# Patient Record
Sex: Female | Born: 1965 | Hispanic: No | Marital: Single | Smoking: Never smoker
Health system: Southern US, Community
[De-identification: ages and names within clinical notes are randomized; demographics above are authoritative.]

## PROBLEM LIST (undated history)

## (undated) DIAGNOSIS — I10 Essential (primary) hypertension: Secondary | ICD-10-CM

## (undated) DIAGNOSIS — E785 Hyperlipidemia, unspecified: Secondary | ICD-10-CM

## (undated) DIAGNOSIS — D649 Anemia, unspecified: Secondary | ICD-10-CM

## (undated) DIAGNOSIS — E559 Vitamin D deficiency, unspecified: Secondary | ICD-10-CM

## (undated) HISTORY — DX: Vitamin D deficiency, unspecified: E55.9

## (undated) HISTORY — DX: Morbid (severe) obesity due to excess calories: E66.01

## (undated) HISTORY — DX: Hyperlipidemia, unspecified: E78.5

## (undated) HISTORY — DX: Anemia, unspecified: D64.9

## (undated) HISTORY — DX: Essential (primary) hypertension: I10

---

## 1998-03-14 ENCOUNTER — Encounter (HOSPITAL_COMMUNITY): Admission: RE | Admit: 1998-03-14 | Discharge: 1998-03-17 | Payer: Self-pay | Admitting: Obstetrics & Gynecology

## 1998-03-15 ENCOUNTER — Inpatient Hospital Stay (HOSPITAL_COMMUNITY): Admission: AD | Admit: 1998-03-15 | Discharge: 1998-03-19 | Payer: Self-pay | Admitting: Obstetrics

## 1998-03-21 ENCOUNTER — Inpatient Hospital Stay (HOSPITAL_COMMUNITY): Admission: AD | Admit: 1998-03-21 | Discharge: 1998-03-21 | Payer: Self-pay | Admitting: Obstetrics & Gynecology

## 1998-05-08 ENCOUNTER — Inpatient Hospital Stay (HOSPITAL_COMMUNITY): Admission: AD | Admit: 1998-05-08 | Discharge: 1998-05-08 | Payer: Self-pay | Admitting: Obstetrics & Gynecology

## 1999-09-24 ENCOUNTER — Emergency Department (HOSPITAL_COMMUNITY): Admission: EM | Admit: 1999-09-24 | Discharge: 1999-09-24 | Payer: Self-pay | Admitting: Emergency Medicine

## 2000-11-22 ENCOUNTER — Emergency Department (HOSPITAL_COMMUNITY): Admission: EM | Admit: 2000-11-22 | Discharge: 2000-11-22 | Payer: Self-pay | Admitting: *Deleted

## 2001-01-04 ENCOUNTER — Emergency Department (HOSPITAL_COMMUNITY): Admission: EM | Admit: 2001-01-04 | Discharge: 2001-01-04 | Payer: Self-pay | Admitting: *Deleted

## 2004-06-02 ENCOUNTER — Emergency Department (HOSPITAL_COMMUNITY): Admission: EM | Admit: 2004-06-02 | Discharge: 2004-06-02 | Payer: Self-pay | Admitting: Emergency Medicine

## 2005-08-04 ENCOUNTER — Emergency Department (HOSPITAL_COMMUNITY): Admission: EM | Admit: 2005-08-04 | Discharge: 2005-08-04 | Payer: Self-pay | Admitting: Emergency Medicine

## 2008-12-27 HISTORY — PX: BREAST BIOPSY: SHX20

## 2009-01-16 ENCOUNTER — Encounter: Admission: RE | Admit: 2009-01-16 | Discharge: 2009-01-16 | Payer: Self-pay | Admitting: Emergency Medicine

## 2009-01-23 ENCOUNTER — Encounter: Admission: RE | Admit: 2009-01-23 | Discharge: 2009-01-23 | Payer: Self-pay | Admitting: Emergency Medicine

## 2009-02-11 ENCOUNTER — Encounter (INDEPENDENT_AMBULATORY_CARE_PROVIDER_SITE_OTHER): Payer: Self-pay | Admitting: Emergency Medicine

## 2009-02-11 ENCOUNTER — Encounter: Admission: RE | Admit: 2009-02-11 | Discharge: 2009-02-11 | Payer: Self-pay | Admitting: Emergency Medicine

## 2010-03-30 ENCOUNTER — Ambulatory Visit (HOSPITAL_COMMUNITY): Admission: RE | Admit: 2010-03-30 | Discharge: 2010-03-30 | Payer: Self-pay | Admitting: Emergency Medicine

## 2011-01-17 ENCOUNTER — Encounter: Payer: Self-pay | Admitting: Emergency Medicine

## 2011-03-05 ENCOUNTER — Other Ambulatory Visit (HOSPITAL_COMMUNITY): Payer: Self-pay | Admitting: Diagnostic Radiology

## 2011-04-27 ENCOUNTER — Other Ambulatory Visit (HOSPITAL_COMMUNITY): Payer: Self-pay | Admitting: Family Medicine

## 2011-04-27 DIAGNOSIS — Z1231 Encounter for screening mammogram for malignant neoplasm of breast: Secondary | ICD-10-CM

## 2011-05-05 ENCOUNTER — Ambulatory Visit (HOSPITAL_COMMUNITY)
Admission: RE | Admit: 2011-05-05 | Discharge: 2011-05-05 | Disposition: A | Discharge status: Home / Self Care | Payer: Self-pay | Source: Ambulatory Visit | Attending: Family Medicine | Admitting: Family Medicine

## 2011-05-05 DIAGNOSIS — Z1231 Encounter for screening mammogram for malignant neoplasm of breast: Secondary | ICD-10-CM

## 2012-05-08 ENCOUNTER — Other Ambulatory Visit (HOSPITAL_COMMUNITY): Payer: Self-pay | Admitting: Family Medicine

## 2012-05-08 DIAGNOSIS — Z1231 Encounter for screening mammogram for malignant neoplasm of breast: Secondary | ICD-10-CM

## 2012-06-02 ENCOUNTER — Ambulatory Visit (HOSPITAL_COMMUNITY)
Admission: RE | Admit: 2012-06-02 | Discharge: 2012-06-02 | Disposition: A | Discharge status: Home / Self Care | Payer: Medicaid Other | Source: Ambulatory Visit | Attending: Family Medicine | Admitting: Family Medicine

## 2012-06-02 DIAGNOSIS — Z1231 Encounter for screening mammogram for malignant neoplasm of breast: Secondary | ICD-10-CM | POA: Insufficient documentation

## 2013-06-06 ENCOUNTER — Other Ambulatory Visit (HOSPITAL_COMMUNITY): Payer: Self-pay | Admitting: *Deleted

## 2013-06-06 DIAGNOSIS — Z1231 Encounter for screening mammogram for malignant neoplasm of breast: Secondary | ICD-10-CM

## 2013-06-19 ENCOUNTER — Ambulatory Visit (HOSPITAL_COMMUNITY)
Admission: RE | Admit: 2013-06-19 | Discharge: 2013-06-19 | Disposition: A | Discharge status: Home / Self Care | Payer: Medicaid Other | Source: Ambulatory Visit | Attending: *Deleted | Admitting: *Deleted

## 2013-06-19 DIAGNOSIS — Z1231 Encounter for screening mammogram for malignant neoplasm of breast: Secondary | ICD-10-CM | POA: Insufficient documentation

## 2014-06-05 ENCOUNTER — Other Ambulatory Visit (HOSPITAL_COMMUNITY): Payer: Self-pay | Admitting: *Deleted

## 2014-06-05 DIAGNOSIS — Z1231 Encounter for screening mammogram for malignant neoplasm of breast: Secondary | ICD-10-CM

## 2014-06-25 ENCOUNTER — Ambulatory Visit (HOSPITAL_COMMUNITY)
Admission: RE | Admit: 2014-06-25 | Discharge: 2014-06-25 | Disposition: A | Discharge status: Home / Self Care | Payer: Medicaid Other | Source: Ambulatory Visit | Attending: *Deleted | Admitting: *Deleted

## 2014-06-25 DIAGNOSIS — Z1231 Encounter for screening mammogram for malignant neoplasm of breast: Secondary | ICD-10-CM

## 2014-06-25 DIAGNOSIS — Z803 Family history of malignant neoplasm of breast: Secondary | ICD-10-CM | POA: Insufficient documentation

## 2015-08-18 ENCOUNTER — Other Ambulatory Visit (HOSPITAL_COMMUNITY): Payer: Self-pay | Admitting: *Deleted

## 2015-08-18 DIAGNOSIS — Z1231 Encounter for screening mammogram for malignant neoplasm of breast: Secondary | ICD-10-CM

## 2015-08-28 ENCOUNTER — Ambulatory Visit (HOSPITAL_COMMUNITY)
Admission: RE | Admit: 2015-08-28 | Discharge: 2015-08-28 | Disposition: A | Discharge status: Home / Self Care | Payer: Medicaid Other | Source: Ambulatory Visit | Attending: *Deleted | Admitting: *Deleted

## 2015-08-28 DIAGNOSIS — Z1231 Encounter for screening mammogram for malignant neoplasm of breast: Secondary | ICD-10-CM | POA: Insufficient documentation

## 2017-07-01 ENCOUNTER — Encounter: Payer: Self-pay | Admitting: Gastroenterology

## 2017-08-10 ENCOUNTER — Telehealth: Payer: Self-pay | Admitting: *Deleted

## 2017-08-10 NOTE — Telephone Encounter (Signed)
Office visit would be best. Thanks 

## 2017-08-10 NOTE — Telephone Encounter (Signed)
Attempted pt again - Lm to return call at [phone] and [facility] for [facility]. Hilda LiasMarie PV

## 2017-08-10 NOTE — Telephone Encounter (Signed)
Attempted pt. Left a message to call back and ask for me in pre visit.  Cassidy Taylor PV

## 2017-08-10 NOTE — Telephone Encounter (Signed)
Dr Adela LankArmbruster,  This pt is scheduled for a pre visit on 8-27 and a direct colon with you on 9-17 Monday in the [facility].  She has no GI history.  She has a medical history of HTN, HLD, Vitamin d def. She has a weight of 317.0 lb and a BMI of 54.4.  Do you want her to have an OV or can she be a direct hospital ?  Please advise, thank you for your time, Elizebeth BrookingMarie PV

## 2017-08-11 NOTE — Telephone Encounter (Signed)
Called pt- LM to call [phone] and ask for a pre visit nurse- Hilda LiasMarie PV

## 2017-08-16 ENCOUNTER — Encounter: Payer: Self-pay | Admitting: Gastroenterology

## 2017-08-16 NOTE — Telephone Encounter (Signed)
10-23 at 3 pm Ov with Dr Adela LankHilda Lias PV

## 2017-08-16 NOTE — Telephone Encounter (Signed)
Called pt at home number listed- Lm to call [phone]. Called work number lsited and they state pt has not worked there in 2 years. Removed work number from [employer] chart. Cassidy Taylor

## 2017-09-12 ENCOUNTER — Encounter: Payer: Self-pay | Admitting: Gastroenterology

## 2017-10-18 ENCOUNTER — Ambulatory Visit (INDEPENDENT_AMBULATORY_CARE_PROVIDER_SITE_OTHER): Payer: Self-pay | Admitting: Gastroenterology

## 2017-10-18 ENCOUNTER — Encounter (INDEPENDENT_AMBULATORY_CARE_PROVIDER_SITE_OTHER): Payer: Self-pay

## 2017-10-18 ENCOUNTER — Encounter: Payer: Self-pay | Admitting: Gastroenterology

## 2017-10-18 VITALS — BP 130/70 | HR 80 | Ht 64.5 in | Wt 316.0 lb

## 2017-10-18 DIAGNOSIS — Z1211 Encounter for screening for malignant neoplasm of colon: Secondary | ICD-10-CM

## 2017-10-18 NOTE — Progress Notes (Signed)
   HPI :  51 y/o female with history of morbid obesity (BMI > 50), HTN, HLD, here for a new patient visit to discuss colon cancer screening.  Patient states she has had blood in the stools, scant amount, 2 times in the past year, has occurred in the setting of constipation. She normally does not have constipation, has normal bowel habits. She denies any abdominal pains otherwise. No nausea / vomiting. She denies any FH of colon cancer. She thinks some of her sisters have had colon polyps.   She otherwise has no complaints today, feels well. We discussed options for colon cancer screening in light of her BMI.    Labs: 07/11/2017 - Hgb 12.1, WBC 10.8, plt 276 LFTs normal    Past Medical History:  Diagnosis Date  . Hyperlipemia   . Hypertension   . Obesity, morbid, BMI 50 or higher (HCC)   . Vitamin D deficiency      Past Surgical History:  Procedure Laterality Date  . CESAREAN SECTION  02/1998   Family History  Problem Relation Age of Onset  . Breast cancer Mother    Social History  Substance Use Topics  . Smoking status: Never Smoker  . Smokeless tobacco: Never Used  . Alcohol use No   Current Outpatient Prescriptions  Medication Sig Dispense Refill  . ergocalciferol (VITAMIN D2) 50000 units capsule Take 50,000 Units by mouth once a week.    Marland Kitchen. ibuprofen (ADVIL,MOTRIN) 200 MG tablet Take 200 mg by mouth as needed.    Marland Kitchen. lisinopril-hydrochlorothiazide (PRINZIDE,ZESTORETIC) 20-25 MG tablet Take 1 tablet by mouth daily.    . simvastatin (ZOCOR) 40 MG tablet Take 40 mg by mouth daily.     No current facility-administered medications for this visit.    No Known Allergies   Review of Systems: All systems reviewed and negative except where noted in HPI.   Labs per primary care in HPI above  Physical Exam: BP 130/70   Pulse 80   Ht 5' 4.5" (1.638 m)   Wt (!) 316 lb (143.3 kg)   LMP 10/12/2017 (Exact Date)   BMI 53.40 kg/m  Constitutional: Pleasant  female in no  acute distress. HEENT: Normocephalic and atraumatic. Conjunctivae are normal. No scleral icterus. Neck supple.  Cardiovascular: Normal rate, regular rhythm.  Pulmonary/chest: Effort normal and breath sounds normal. No wheezing, rales or rhonchi. Abdominal: Soft, nondistended, protuberant / obese abdomen. There are no masses palpable. No hepatomegaly. Extremities: no edema Lymphadenopathy: No cervical adenopathy noted. Neurological: Alert and oriented to person place and time. Skin: Skin is warm and dry. No rashes noted. Psychiatric: Normal mood and affect. Behavior is normal.   ASSESSMENT AND PLAN: 51 y/o female with a history of morbid obesity, BMI > 50, here to discuss options for colon cancer screening. We discussed optical colonoscopy versus stools based testing, risks / benefits of each. Given her BMI she is higher than average risk for anesthesia related complications, and her case would need to be done at the hospital. In light of her prior episodes of rare rectal bleeding, optical colonoscopy would be most appropriate for her, ensure no bleeding polyp / mass lesion. Following discussion of this issue, she wanted to proceed with colonoscopy. Further recommendations pending the result.  Ileene PatrickSteven Alizah Sills, MD [facility] Pager [phone]

## 2017-10-18 NOTE — Patient Instructions (Addendum)
It has been recommended to you by your physician that you have a colonoscopy completed. Per your request, we did not schedule the procedure(s) today. We will place you on our wait list for November and December as those dates are currently full.  If nothing comes available we will contact you once the January 2019 schedule is available.    Thank you.

## 2017-10-20 ENCOUNTER — Telehealth: Payer: Self-pay

## 2017-10-20 NOTE — Telephone Encounter (Signed)
Left message on pt's cell phone to call back regarding [facility]

## 2017-10-20 NOTE — Telephone Encounter (Signed)
Called pt and left message about an new opening Dr. Adela LankArmbruster has on [facility]-28-17 [facility]  Procedure would be at 11:45am with arrival time at 10:15am.  Left message to call me back to discuss.  (Pt was in clinic this week but his schedule was full for November 2nd and December 4th).

## 2017-10-26 NOTE — Telephone Encounter (Signed)
Never heard back from pt regarding 10-28-17 opening.  January schedule has opened up and [facility]erv[facility].  Left message for pt to call me back regarding getting colon scheduled.

## 2017-10-27 ENCOUNTER — Telehealth: Payer: Self-pay

## 2017-10-27 NOTE — Telephone Encounter (Signed)
-----   Message from Cooper RenderJanet M Shenika Quint, CMA sent at 10/18/2017  4:08 PM EDT ----- Regarding: [facility]  [facility] BMI over 50.  November and December dates were full:  10-28-2017 and  11-29-2017. Pt not interested in 12-19-17.   Call pt if 11-2 or 12-4 spot becomes available OR when January schedule becomes available.

## 2017-10-27 NOTE — Telephone Encounter (Signed)
See letter and telephone call messages regarding multiple attempts to call pt to [facility] with Dr[facility] Adela LankArmbruster.

## 2018-08-11 ENCOUNTER — Other Ambulatory Visit: Payer: Self-pay

## 2018-08-11 DIAGNOSIS — Z1231 Encounter for screening mammogram for malignant neoplasm of breast: Secondary | ICD-10-CM

## 2018-08-14 ENCOUNTER — Other Ambulatory Visit: Payer: Self-pay | Admitting: Obstetrics and Gynecology

## 2018-08-14 DIAGNOSIS — Z1231 Encounter for screening mammogram for malignant neoplasm of breast: Secondary | ICD-10-CM

## 2018-09-26 ENCOUNTER — Ambulatory Visit
Admission: RE | Admit: 2018-09-26 | Discharge: 2018-09-26 | Disposition: A | Discharge status: Home / Self Care | Payer: Self-pay | Source: Ambulatory Visit | Attending: Obstetrics and Gynecology | Admitting: Obstetrics and Gynecology

## 2018-09-26 ENCOUNTER — Ambulatory Visit (HOSPITAL_COMMUNITY)
Admission: RE | Admit: 2018-09-26 | Discharge: 2018-09-26 | Disposition: A | Discharge status: Home / Self Care | Payer: Medicaid Other | Source: Ambulatory Visit | Attending: Obstetrics and Gynecology | Admitting: Obstetrics and Gynecology

## 2018-09-26 ENCOUNTER — Encounter (HOSPITAL_COMMUNITY): Payer: Self-pay

## 2018-09-26 VITALS — BP 152/84 | Ht 65.0 in | Wt 304.0 lb

## 2018-09-26 DIAGNOSIS — Z1239 Encounter for other screening for malignant neoplasm of breast: Secondary | ICD-10-CM

## 2018-09-26 DIAGNOSIS — Z1231 Encounter for screening mammogram for malignant neoplasm of breast: Secondary | ICD-10-CM

## 2018-09-26 NOTE — Patient Instructions (Signed)
Explained breast self awareness with Cassidy Taylor. Patient did not need a Pap smear today due to last Pap smear was in June or July 2017 per patient. Let her know B[facility]CP will cover Pap smears every 3 years unless has a history of abnormal Pap smears. Referred patient to the [facility] for a screening mammogram. Appointment scheduled for Tuesday, September 26, 2018 at 1350. Patient aware of appointment and will be there. Let patient know the Breast Center will follow up with her within the next couple weeks with results of mammogram by letter or phone. [name] verbalized understanding.  Emery Binz, Kathaleen Maser, RN 1:16 PM

## 2018-09-26 NOTE — Progress Notes (Signed)
No complaints today.   Pap Smear: Pap smear not completed today. Last Pap smear was in June or July 2017 at the Urgent Care at [facility] and normal with negative HPV. Per patient has no history of an abnormal Pap smear. No Pap smear results are in Epic.  Physical exam: Breasts Breasts symmetrical. No skin abnormalities bilateral breasts. No nipple retraction bilateral breasts. No nipple discharge bilateral breasts. No lymphadenopathy. No lumps palpated bilateral breasts. No complaints of pain or tenderness on exam. Referred patient to the [facility] for a screening mammogram. Appointment scheduled for Tuesday, September 26, 2018 at 1350.        Pelvic/Bimanual No Pap smear completed today since last Pap smear was in June or July 2017 per patient. Pap smear not indicated per BCCCP guidelines.   Smoking History: Patient has never smoked.  Patient Navigation: Patient education provided. Access to services provided for patient through BCCCP program.   Colorectal Cancer Screening: Per patient has never had a colonoscopy completed. No complaints today. Patient has a a FIT Test at home that her PCP gave her to complete.  Breast and Cervical Cancer Risk Assessment: Patient has a family history of her mother having breast cancer. Patient has no known genetic mutations or history of radiation treatment to the chest before age 64. Patient has no history of cervical dysplasia, immunocompromised, or DES exposure in-utero.  Risk Assessment    Risk Scores      09/26/2018   Last edited by: Lynnell Dike, LPN   5-year risk: 1.9 %   Lifetime risk: 13.2 %

## 2018-10-23 ENCOUNTER — Encounter (HOSPITAL_COMMUNITY): Payer: Self-pay | Admitting: *Deleted

## 2018-11-16 IMAGING — MG DIGITAL SCREENING BILATERAL MAMMOGRAM WITH TOMO AND CAD
8 of 18 series · 8 of 40 positions shown · non-contrast
Comparison: Previous exam(s).

CLINICAL DATA: Screening.

EXAM:
DIGITAL SCREENING BILATERAL MAMMOGRAM WITH TOMO AND CAD

[R MLO synth-2D (1 of 2)]
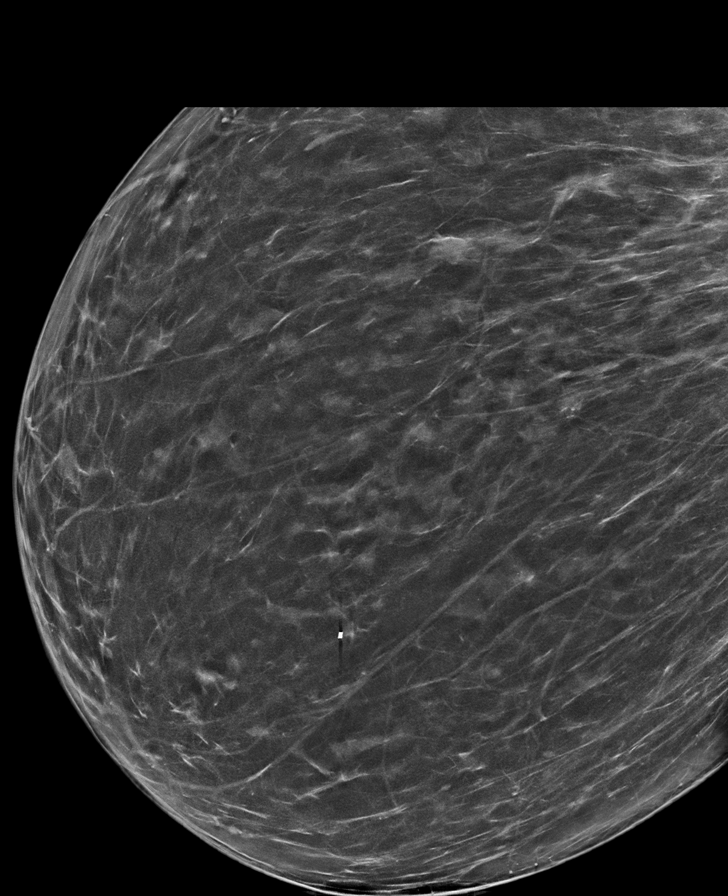

[L MLO synth-2D (1 of 3)]
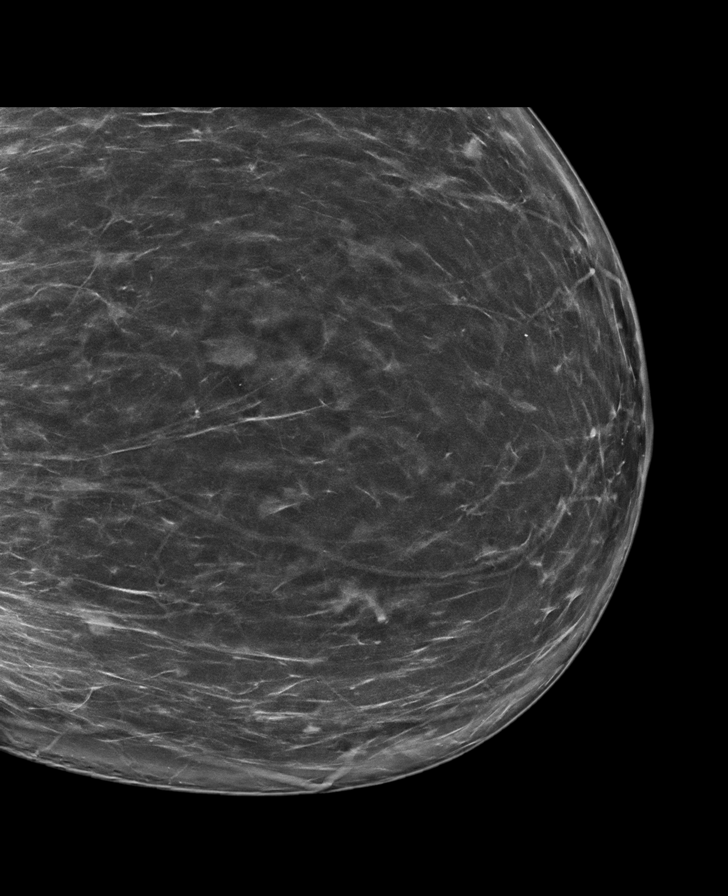

[R CC synth-2D]
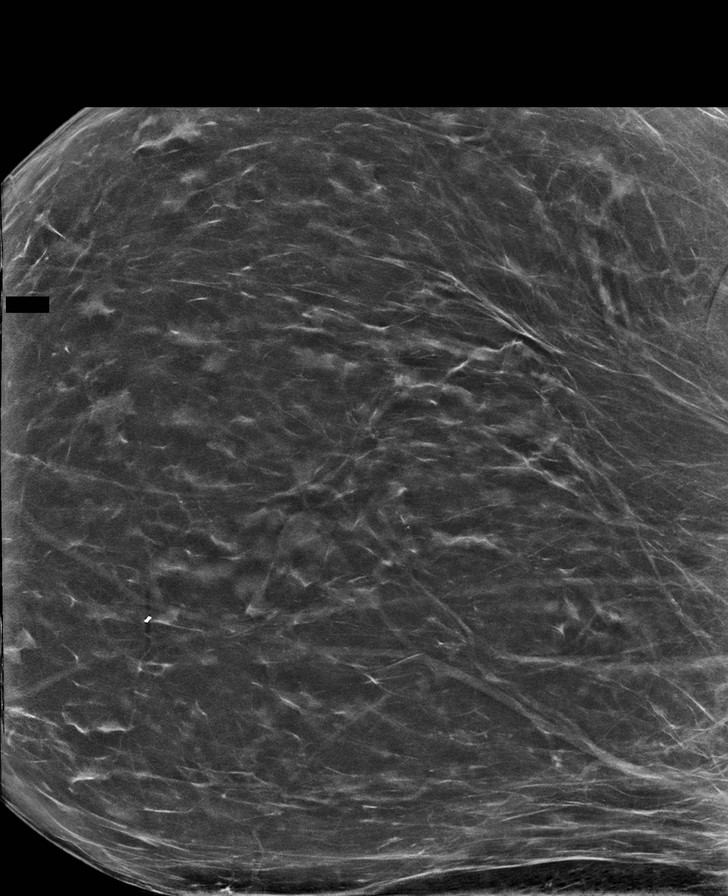

[L MLO synth-2D (2 of 3)]
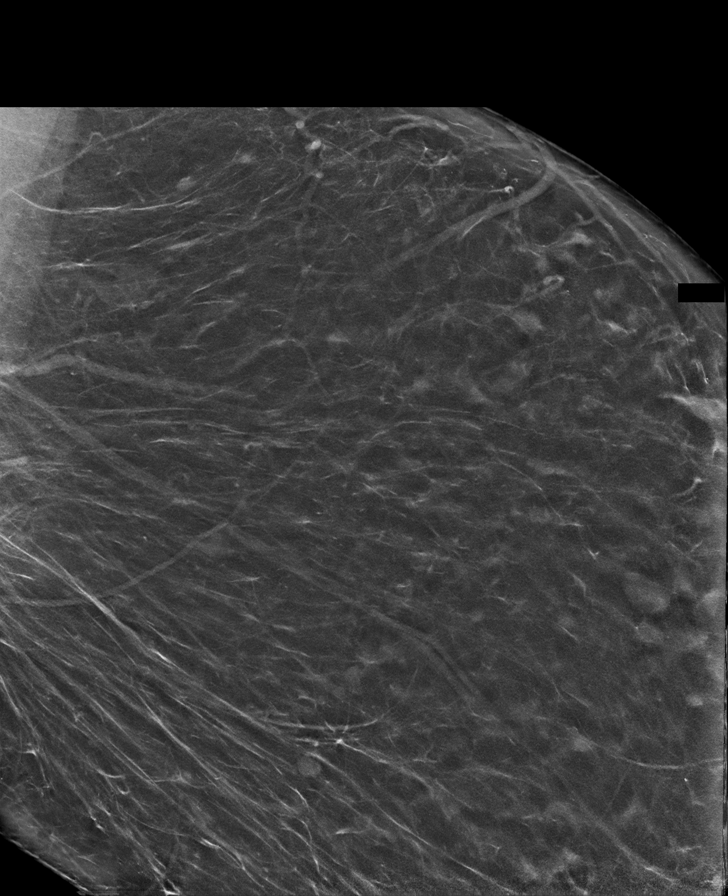

[R MLO synth-2D (2 of 2)]
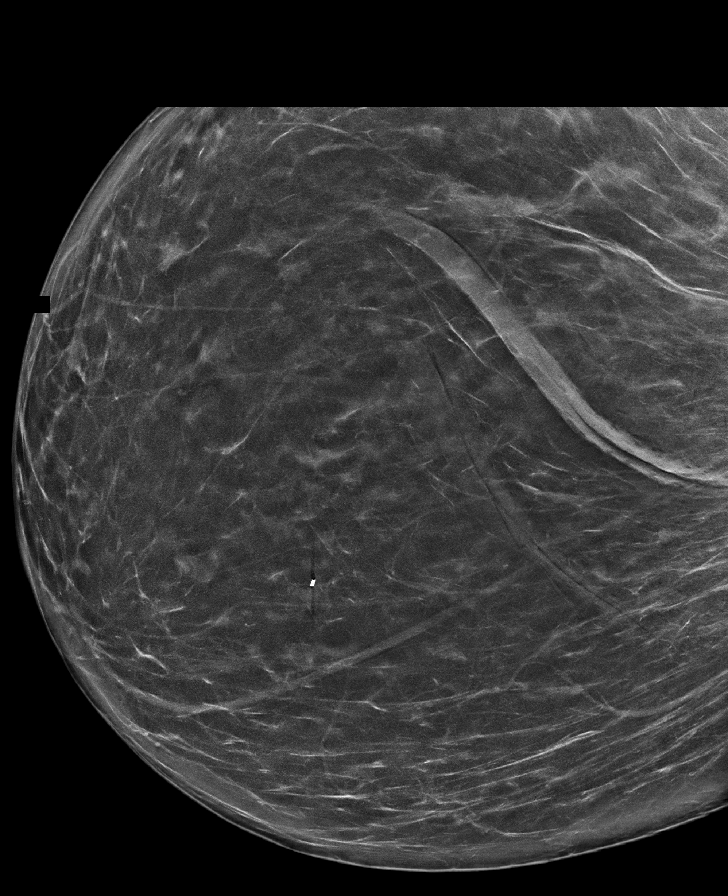

[R CV synth-2D]
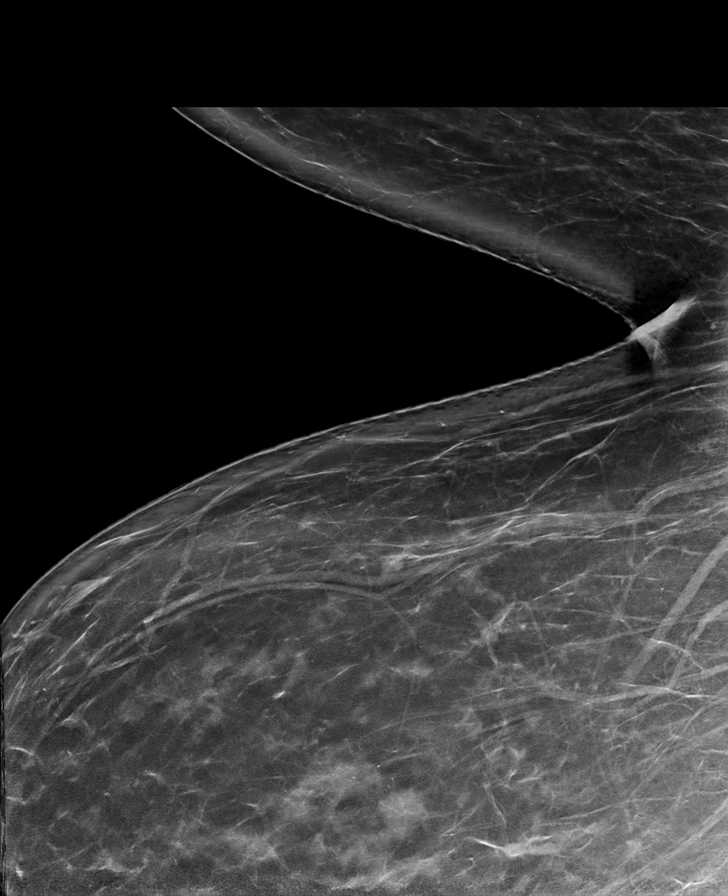

[L MLO synth-2D (3 of 3)]
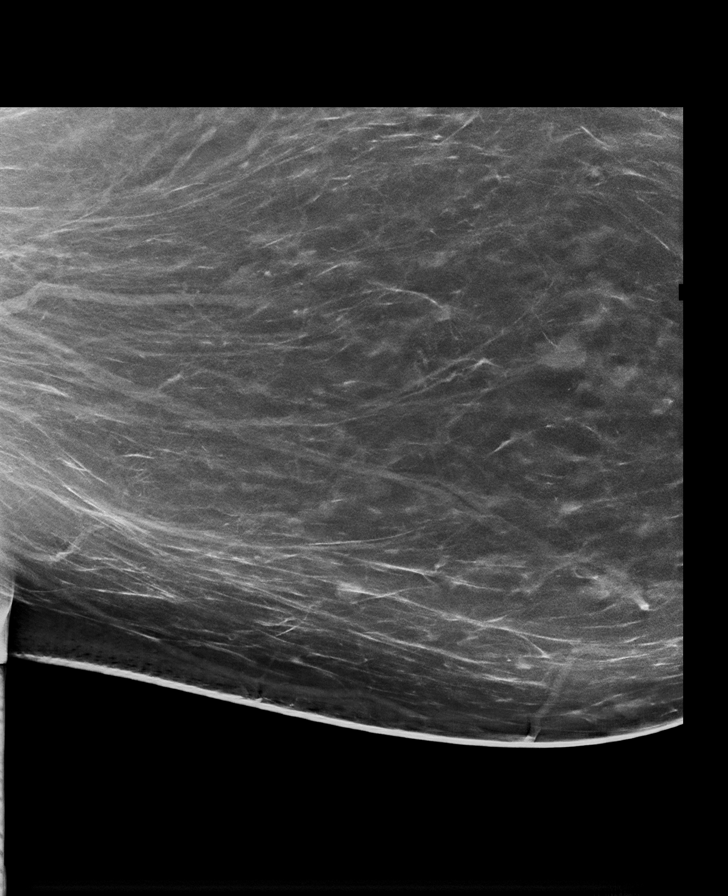

[L CC tomo · tomo slice 47/93.0]
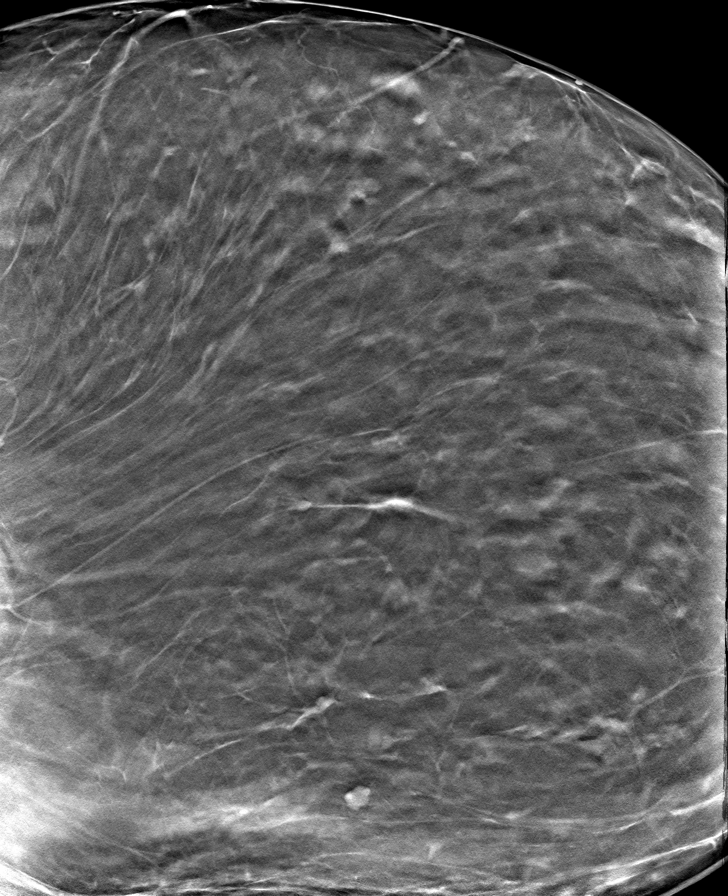

[8 of 40 positions shown; findings below may reference images not displayed]

ACR Breast Density Category b: There are scattered areas of
fibroglandular density.
FINDINGS: There are no findings suspicious for malignancy. Images were
processed with CAD.
IMPRESSION: No mammographic evidence of malignancy. A result letter of this
screening mammogram will be mailed directly to the patient.

RECOMMENDATION:
Screening mammogram in one year. (Code:CN-U-775)

BI-RADS CATEGORY  1: Negative.

## 2019-07-30 ENCOUNTER — Encounter (HOSPITAL_COMMUNITY): Payer: Self-pay

## 2019-07-30 ENCOUNTER — Emergency Department (HOSPITAL_COMMUNITY)
Admission: EM | Admit: 2019-07-30 | Discharge: 2019-07-30 | Disposition: A | Discharge status: Home / Self Care | Payer: PRIVATE HEALTH INSURANCE | Attending: Emergency Medicine | Admitting: Emergency Medicine

## 2019-07-30 ENCOUNTER — Other Ambulatory Visit: Payer: Self-pay

## 2019-07-30 DIAGNOSIS — I10 Essential (primary) hypertension: Secondary | ICD-10-CM | POA: Insufficient documentation

## 2019-07-30 DIAGNOSIS — L03114 Cellulitis of left upper limb: Secondary | ICD-10-CM

## 2019-07-30 DIAGNOSIS — Z79899 Other long term (current) drug therapy: Secondary | ICD-10-CM | POA: Insufficient documentation

## 2019-07-30 DIAGNOSIS — L239 Allergic contact dermatitis, unspecified cause: Secondary | ICD-10-CM | POA: Insufficient documentation

## 2019-07-30 MED ORDER — CEPHALEXIN 500 MG PO CAPS: 500.0000 mg | ORAL_CAPSULE | Freq: Four times a day (QID) | ORAL | 0 refills | Status: AC

## 2019-07-30 MED ORDER — PREDNISONE 20 MG PO TABS: 40.0000 mg | ORAL_TABLET | Freq: Every day | ORAL | 0 refills | Status: AC

## 2019-07-30 NOTE — ED Triage Notes (Signed)
Pt states that on Saturday, she was out and put her arm up on a couch. Pt now has red spots and blistering on her left arm, from her elbow up to her shoulder. Left hip and outside thigh, right hip. Pt describes the area as very itchy. Pt states that she put hydrocortisone 2.5% on it, which helped with itching.

## 2019-07-30 NOTE — Discharge Instructions (Addendum)
Please take the antibiotic as prescribed for possible skin infection.  Recommend taking the steroids for the suspected skin reaction.  Recommend taking as needed Benadryl for itching.  If he redness worsens, you develop fevers, nausea, vomiting, difficulty breathing or other new concerning symptoms recommend return to the ER for reassessment.  Please call your primary doctor to schedule appointment to be seen within 24-48 hours for recheck of your symptoms today.

## 2019-07-30 NOTE — ED Provider Notes (Signed)
[facility]-EMERGENCY DEPT Provider Note   CSN: [id number] Arrival date & time: 07/30/19  [id number]     History   Chief Complaint Chief Complaint  Patient presents with  . Rash    HPI Thomasenia BottomsKathy Stripling is a 53 y.o. female.  Saturday afternoon patient was at a friend's house sitting on a chair when she suspects that she may have had a bug bite to her left lateral arm.  States that she soon after noted some redness and itching of her left arm.  Later that evening also noted a couple small spots on her left hip and right hip area.  States they have been quite itchy, not particularly painful.  Has been taking Benadryl.  Has tried hydrocortisone cream with some improvement in symptoms.  Denies any fever or other systemic symptoms such as fatigue, difficulty breathing, chest pain, nausea, vomiting, abdominal pain.  Patient denies any sick contacts.  Patient states has a history of hypertension, hyperlipidemia.  Denies history of diabetes.     HPI  Past Medical History:  Diagnosis Date  . Hyperlipemia   . Hypertension   . Obesity, morbid, BMI 50 or higher (HCC)   . Vitamin D deficiency     There are no active problems to display for this patient.   Past Surgical History:  Procedure Laterality Date  . BREAST BIOPSY Right 2010   benign  . CESAREAN SECTION  02/1998     OB History    Gravida  3   Para      Term      Preterm      AB      Living  3     SAB      TAB      Ectopic      Multiple      Live Births  3            Home Medications    Prior to Admission medications   Medication Sig Start Date End Date Taking? Authorizing Provider  ergocalciferol (VITAMIN D2) 50000 units capsule Take 50,000 Units by mouth once a week.    [provider]  ibuprofen (ADVIL,MOTRIN) 200 MG tablet Take 200 mg by mouth as needed.    [provider]  lisinopril-hydrochlorothiazide (PRINZIDE,ZESTORETIC) 20-25 MG tablet Take 1 tablet by mouth  daily.    [provider]  simvastatin (ZOCOR) 40 MG tablet Take 40 mg by mouth daily.    [provider]    Family History Family History  Problem Relation Age of Onset  . Breast cancer Mother   . Cancer Father        lung    Social History Social History   Tobacco Use  . Smoking status: Never Smoker  . Smokeless tobacco: Never Used  Substance Use Topics  . Alcohol use: No  . Drug use: No     Allergies   Patient has no known allergies.   Review of Systems Review of Systems  Constitutional: Negative for chills and fever.  HENT: Negative for ear pain and sore throat.   Eyes: Negative for pain and visual disturbance.  Respiratory: Negative for cough and shortness of breath.   Cardiovascular: Negative for chest pain and palpitations.  Gastrointestinal: Negative for abdominal pain and vomiting.  Genitourinary: Negative for dysuria and hematuria.  Musculoskeletal: Negative for arthralgias and back pain.  Skin: Positive for rash. Negative for color change.  Neurological: Negative for seizures and syncope.  All other  systems reviewed and are negative.    Physical Exam Updated Vital Signs BP (!) 173/88 (BP Location: Right Arm)   Pulse (!) 127   Temp 99.2 F (37.3 C) (Oral)   Resp (!) 22   Ht 5\' 4"  (1.626 m)   Wt 131.5 kg   LMP 07/10/2019   SpO2 100%   BMI 49.78 kg/m   Physical Exam Vitals signs and nursing note reviewed.  Constitutional:      General: She is not in acute distress.    Appearance: She is well-developed.  HENT:     Head: Normocephalic and atraumatic.  Eyes:     Conjunctiva/sclera: Conjunctivae normal.  Neck:     Musculoskeletal: Neck supple.  Cardiovascular:     Rate and Rhythm: Regular rhythm. Tachycardia present.     Heart sounds: No murmur.  Pulmonary:     Effort: Pulmonary effort is normal. No respiratory distress.     Breath sounds: Normal breath sounds.  Abdominal:     Palpations: Abdomen is soft.      Tenderness: There is no abdominal tenderness.  Skin:    Comments: LUE: 4x10 cm area erythema over lateral upper arm extending to proximal elbow, 3 ~47mm raised blisters overlying, no purulence, negative nikolsky's not warm to touch, no underlying induration, normal elbow ROM Left lateral buttocks: 2cm diameter blanching patch of erythema, not raised R lateral buttocks: 2cm diameter blanching patch of erythema, not raised  Neurological:     Mental Status: She is alert.      ED Treatments / Results  Labs (all labs ordered are listed, but only abnormal results are displayed) Labs Reviewed - No data to display  EKG None  Radiology No results found.  Procedures Procedures (including critical care time)  Medications Ordered in ED Medications - No data to display   Initial Impression / Assessment and Plan / ED Course  I have reviewed the triage vital signs and the nursing notes.  Pertinent labs & imaging results that were available during my care of the patient were reviewed by me and considered in my medical decision making (see chart for details).        53 year old lady presents emergency department with new onset rash.  Negative Nikolsky sign, no new medications, no involvement of mucous membranes.  Based on description of symptoms and rash, suspect patient has some sort of contact dermatitis.  No clear trigger.  Given extent of erythema over arm, may have degree of superimposed cellulitis.   Noted initial tachycardia, patient stated she had walked a long distance right before her heart rate was checked.  She denied any systemic symptoms and remained well-appearing while in the department.  At this time believe appropriate for outpatient management.  Will cover with short course steroids and course of antibiotics.  Recommended recheck with primary doctor in 24 to 48 hours.   Final Clinical Impressions(s) / ED Diagnoses   Final diagnoses:  Allergic contact dermatitis,  unspecified trigger  Cellulitis of left upper extremity    ED Discharge Orders    None       Lucrezia Starch, MD 07/30/19 1108

## 2019-08-06 ENCOUNTER — Emergency Department (HOSPITAL_COMMUNITY)
Admission: EM | Admit: 2019-08-06 | Discharge: 2019-08-06 | Disposition: A | Discharge status: Home / Self Care | Payer: PRIVATE HEALTH INSURANCE | Attending: Emergency Medicine | Admitting: Emergency Medicine

## 2019-08-06 ENCOUNTER — Encounter (HOSPITAL_COMMUNITY): Payer: Self-pay | Admitting: Emergency Medicine

## 2019-08-06 ENCOUNTER — Other Ambulatory Visit: Payer: Self-pay

## 2019-08-06 DIAGNOSIS — Z79899 Other long term (current) drug therapy: Secondary | ICD-10-CM | POA: Insufficient documentation

## 2019-08-06 DIAGNOSIS — L299 Pruritus, unspecified: Secondary | ICD-10-CM | POA: Insufficient documentation

## 2019-08-06 DIAGNOSIS — Z5189 Encounter for other specified aftercare: Secondary | ICD-10-CM | POA: Insufficient documentation

## 2019-08-06 DIAGNOSIS — E876 Hypokalemia: Secondary | ICD-10-CM

## 2019-08-06 DIAGNOSIS — I10 Essential (primary) hypertension: Secondary | ICD-10-CM | POA: Insufficient documentation

## 2019-08-06 DIAGNOSIS — L03114 Cellulitis of left upper limb: Secondary | ICD-10-CM | POA: Insufficient documentation

## 2019-08-06 LAB — BASIC METABOLIC PANEL
Anion gap: 12 (ref 5–15)
BUN: 13 mg/dL (ref 6–20)
CO2: 24 mmol/L (ref 22–32)
Calcium: 9.4 mg/dL (ref 8.9–10.3)
Chloride: 99 mmol/L (ref 98–111)
Creatinine, Ser: 0.76 mg/dL (ref 0.44–1.00)
GFR calc Af Amer: >60 mL/min (ref >60)
GFR calc non Af Amer: >60 mL/min (ref >60)
Glucose, Bld: 109 mg/dL — ABNORMAL HIGH (ref 70–99)
Potassium: 2.9 mmol/L — ABNORMAL LOW (ref 3.5–5.1)
Sodium: 135 mmol/L (ref 135–145)

## 2019-08-06 LAB — CBC WITH DIFFERENTIAL/PLATELET
Abs Immature Granulocytes: 0.06 10*3/uL (ref 0.00–0.07)
Basophils Absolute: 0.1 10*3/uL (ref 0.0–0.1)
Basophils Relative: 1 %
Eosinophils Absolute: 0.1 10*3/uL (ref 0.0–0.5)
Eosinophils Relative: 1 %
HCT: 36.8 % (ref 36.0–46.0)
Hemoglobin: 11.5 g/dL — ABNORMAL LOW (ref 12.0–15.0)
Immature Granulocytes: 0 %
Lymphocytes Relative: 19 %
Lymphs Abs: 2.8 10*3/uL (ref 0.7–4.0)
MCH: 26.6 pg (ref 26.0–34.0)
MCHC: 31.3 g/dL (ref 30.0–36.0)
MCV: 85 fL (ref 80.0–100.0)
Monocytes Absolute: 0.7 10*3/uL (ref 0.1–1.0)
Monocytes Relative: 4 %
Neutro Abs: 11.4 10*3/uL — ABNORMAL HIGH (ref 1.7–7.7)
Neutrophils Relative %: 75 %
Platelets: 288 10*3/uL (ref 150–400)
RBC: 4.33 MIL/uL (ref 3.87–5.11)
RDW: 15.6 % — ABNORMAL HIGH (ref 11.5–15.5)
WBC: 15.1 10*3/uL — ABNORMAL HIGH (ref 4.0–10.5)
nRBC: 0 % (ref 0.0–0.2)

## 2019-08-06 MED ORDER — POTASSIUM CHLORIDE CRYS ER 20 MEQ PO TBCR: 20.0000 meq | EXTENDED_RELEASE_TABLET | Freq: Every day | ORAL | 0 refills | Status: DC

## 2019-08-06 NOTE — ED Triage Notes (Signed)
Pt was seen here on 8/3 for insect bit infection and has rash from side effects of medication and was told to come back for wound check.

## 2019-08-06 NOTE — Discharge Instructions (Addendum)
Recommend continuing Benadryl as needed for itching.  Recommend continuing current regimen of your blood pressure medication.  Please call your primary doctor for recheck of your blood pressure medicines, discussion about further management.  If you develop worsening redness, swelling, fever, chest pain or difficulty breathing please return to the emergency department for reassessment.  Recommend take any potassium supplement for the next week and having your primary doctor recheck your potassium levels.

## 2019-08-06 NOTE — ED Notes (Signed)
Call received from pt daughter Genise Strack [phone]- states unable to reach pt via mobile phone, request msg to be sent for pt to rtn call when possible. [employer]

## 2019-08-06 NOTE — ED Provider Notes (Signed)
[facility]-EMERGENCY DEPT Provider Note   CSN: [id number] Arrival date & time: 08/06/19  1410     History   Chief Complaint Chief Complaint  Patient presents with  . Wound Check    HPI Cassidy Taylor is a 53 y.o. female.  Patient presented to emergency department 1 week ago when I evaluated patient at that time.  Suspected either an allergic reaction or cellulitis related to a recent suspected insect bite on her left arm.  At that time I prescribed steroids and antibiotics.  Patient states she completed the course of steroids, completed the first 4 days of the antibiotics but due to suspected side effects from the antibiotic she discontinued this and did not finish the course.  States that her rash has had significant improvement, redness has gone away.  Has had some residual itching.  No associated fevers, chills.  States she wanted to be checked today to make sure everything was improving and she did need additional antibiotics given discontinuing the course early.  Also states that she felt somewhat anxious today noted her blood pressure was elevated.  Patient states that she noted a rash on her left arm after taking the dose of cephalexin, rash resolves spontaneously.  Also states she had one episode of loose stools that she attributed to the cephalexin.     HPI  Past Medical History:  Diagnosis Date  . Hyperlipemia   . Hypertension   . Obesity, morbid, BMI 50 or higher (HCC)   . Vitamin D deficiency     There are no active problems to display for this patient.   Past Surgical History:  Procedure Laterality Date  . BREAST BIOPSY Right 2010   benign  . CESAREAN SECTION  02/1998     OB History    Gravida  3   Para      Term      Preterm      AB      Living  3     SAB      TAB      Ectopic      Multiple      Live Births  3            Home Medications    Prior to Admission medications   Medication Sig Start Date End Date  Taking? Authorizing Provider  cephALEXin (KEFLEX) 500 MG capsule Take 1 capsule (500 mg total) by mouth 4 (four) times daily for 7 days. 07/30/19 08/06/19  Milagros Lollykstra, Jairon Ripberger S, MD  ergocalciferol (VITAMIN D2) 50000 units capsule Take 50,000 Units by mouth once a week.    [provider]  ibuprofen (ADVIL,MOTRIN) 200 MG tablet Take 200 mg by mouth as needed.    [provider]  lisinopril-hydrochlorothiazide (PRINZIDE,ZESTORETIC) 20-25 MG tablet Take 1 tablet by mouth daily.    [provider]  simvastatin (ZOCOR) 40 MG tablet Take 40 mg by mouth daily.    [provider]    Family History Family History  Problem Relation Age of Onset  . Breast cancer Mother   . Cancer Father        lung    Social History Social History   Tobacco Use  . Smoking status: Never Smoker  . Smokeless tobacco: Never Used  Substance Use Topics  . Alcohol use: No  . Drug use: No     Allergies   Patient has no known allergies.   Review of Systems Review of Systems  Constitutional:  Negative for chills and fever.  HENT: Negative for ear pain and sore throat.   Eyes: Negative for pain and visual disturbance.  Respiratory: Negative for cough and shortness of breath.   Cardiovascular: Negative for chest pain and palpitations.  Gastrointestinal: Negative for abdominal pain and vomiting.  Genitourinary: Negative for dysuria and hematuria.  Musculoskeletal: Negative for arthralgias and back pain.  Skin: Positive for rash. Negative for color change.  Neurological: Negative for seizures and syncope.  All other systems reviewed and are negative.    Physical Exam Updated Vital Signs BP (!) 169/93 (BP Location: Right Arm)   Pulse (!) 109   Temp 99 F (37.2 C) (Oral)   Resp 19   Wt (!) 146.1 kg   LMP 07/10/2019   SpO2 97%   BMI 55.27 kg/m   Physical Exam Vitals signs and nursing note reviewed.  Constitutional:      General: She is not in acute distress.     Appearance: She is well-developed.  HENT:     Head: Normocephalic and atraumatic.  Eyes:     Conjunctiva/sclera: Conjunctivae normal.  Neck:     Musculoskeletal: Neck supple.  Cardiovascular:     Rate and Rhythm: Normal rate and regular rhythm.     Heart sounds: No murmur.  Pulmonary:     Effort: Pulmonary effort is normal. No respiratory distress.     Breath sounds: Normal breath sounds.  Abdominal:     Palpations: Abdomen is soft.     Tenderness: There is no abdominal tenderness.  Skin:    Comments: Left upper extremity: Prior area of erythema over left elbow, left proximal arm has resolved, not warm to touch, small 1 cm diameter healing scab noted over left posterior elbow, no underlying induration Area of erythema over left buttocks, right buttocks has also resolved  Neurological:     Mental Status: She is alert.      ED Treatments / Results  Labs (all labs ordered are listed, but only abnormal results are displayed) Labs Reviewed - No data to display  EKG None  Radiology No results found.  Procedures Procedures (including critical care time)  Medications Ordered in ED Medications - No data to display   Initial Impression / Assessment and Plan / ED Course  I have reviewed the triage vital signs and the nursing notes.  Pertinent labs & imaging results that were available during my care of the patient were reviewed by me and considered in my medical decision making (see chart for details).        53 year old lady presented to the ER for wound recheck.  Today, area of concern on her left arm for possible cellulitis versus contact dermatitis has resolved after 4 days of antibiotics and steroid course.  Noted leukocytosis which may be from recent infection or steroids or both.  Given clinical resolution of those symptoms and afebrile, will defer additional antibiotics.  Patient concerned that her blood pressure was elevated today and requested blood work be  obtained.  Noted hypokalemia. Recommended course of K supplement with recheck by PCP. Patient long history of hypertension and followed by her primary doctor.  Recommended recheck of blood pressure with her primary doctor and reevaluation of her anti-hypertensive regimen.  Recommended Benadryl for any residual itching patient was continued to have.  Final Clinical Impressions(s) / ED Diagnoses   Final diagnoses:  Visit for wound check  Pruritus  Essential hypertension  Hypokalemia    ED Discharge Orders  None       Lucrezia Starch, MD 08/06/19 [phone]

## 2021-04-07 ENCOUNTER — Other Ambulatory Visit: Payer: Self-pay | Admitting: Nurse Practitioner

## 2021-04-07 ENCOUNTER — Other Ambulatory Visit: Payer: Self-pay | Admitting: *Deleted

## 2021-04-07 DIAGNOSIS — Z1231 Encounter for screening mammogram for malignant neoplasm of breast: Secondary | ICD-10-CM

## 2021-05-28 ENCOUNTER — Other Ambulatory Visit: Payer: Self-pay

## 2021-05-28 ENCOUNTER — Ambulatory Visit
Admission: RE | Admit: 2021-05-28 | Discharge: 2021-05-28 | Disposition: A | Discharge status: Home / Self Care | Payer: Medicaid Other | Source: Ambulatory Visit | Attending: *Deleted | Admitting: *Deleted

## 2021-05-28 DIAGNOSIS — Z1231 Encounter for screening mammogram for malignant neoplasm of breast: Secondary | ICD-10-CM

## 2022-06-18 LAB — RESULTS CONSOLE HPV: CHL HPV: NEGATIVE

## 2022-06-18 LAB — HM PAP SMEAR

## 2022-07-29 ENCOUNTER — Other Ambulatory Visit: Payer: Self-pay | Admitting: Nurse Practitioner

## 2022-07-29 DIAGNOSIS — Z1231 Encounter for screening mammogram for malignant neoplasm of breast: Secondary | ICD-10-CM

## 2022-08-10 ENCOUNTER — Ambulatory Visit
Admission: RE | Admit: 2022-08-10 | Discharge: 2022-08-10 | Disposition: A | Discharge status: Home / Self Care | Payer: Medicaid Other | Source: Ambulatory Visit | Attending: Nurse Practitioner | Admitting: Nurse Practitioner

## 2022-08-10 DIAGNOSIS — Z1231 Encounter for screening mammogram for malignant neoplasm of breast: Secondary | ICD-10-CM

## 2022-08-13 ENCOUNTER — Other Ambulatory Visit: Payer: Self-pay | Admitting: Nurse Practitioner

## 2022-08-13 DIAGNOSIS — R928 Other abnormal and inconclusive findings on diagnostic imaging of breast: Secondary | ICD-10-CM

## 2022-08-17 ENCOUNTER — Other Ambulatory Visit: Payer: Self-pay | Admitting: Nurse Practitioner

## 2022-08-17 ENCOUNTER — Ambulatory Visit: Payer: Medicaid Other

## 2022-08-17 ENCOUNTER — Ambulatory Visit
Admission: RE | Admit: 2022-08-17 | Discharge: 2022-08-17 | Disposition: A | Discharge status: Home / Self Care | Payer: Medicaid Other | Source: Ambulatory Visit | Attending: Nurse Practitioner | Admitting: Nurse Practitioner

## 2022-08-17 DIAGNOSIS — R928 Other abnormal and inconclusive findings on diagnostic imaging of breast: Secondary | ICD-10-CM

## 2023-02-22 ENCOUNTER — Ambulatory Visit
Admission: RE | Admit: 2023-02-22 | Discharge: 2023-02-22 | Disposition: A | Discharge status: Home / Self Care | Payer: Medicaid Other | Source: Ambulatory Visit | Attending: Nurse Practitioner | Admitting: Nurse Practitioner

## 2023-02-22 DIAGNOSIS — R928 Other abnormal and inconclusive findings on diagnostic imaging of breast: Secondary | ICD-10-CM

## 2023-08-25 ENCOUNTER — Other Ambulatory Visit: Payer: Self-pay | Admitting: Nurse Practitioner

## 2023-08-25 DIAGNOSIS — N6489 Other specified disorders of breast: Secondary | ICD-10-CM

## 2023-09-20 ENCOUNTER — Ambulatory Visit
Admission: RE | Admit: 2023-09-20 | Discharge: 2023-09-20 | Disposition: A | Discharge status: Home / Self Care | Payer: Medicaid Other | Source: Ambulatory Visit | Attending: Nurse Practitioner | Admitting: Nurse Practitioner

## 2023-09-20 DIAGNOSIS — N6489 Other specified disorders of breast: Secondary | ICD-10-CM

## 2024-05-20 ENCOUNTER — Encounter (HOSPITAL_COMMUNITY): Payer: Self-pay

## 2024-05-20 ENCOUNTER — Emergency Department (HOSPITAL_COMMUNITY)
Admission: EM | Admit: 2024-05-20 | Discharge: 2024-05-20 | Disposition: A | Discharge status: Home / Self Care | Attending: Student | Admitting: Student

## 2024-05-20 ENCOUNTER — Other Ambulatory Visit: Payer: Self-pay

## 2024-05-20 DIAGNOSIS — I10 Essential (primary) hypertension: Secondary | ICD-10-CM | POA: Diagnosis not present

## 2024-05-20 DIAGNOSIS — H05229 Edema of unspecified orbit: Secondary | ICD-10-CM | POA: Insufficient documentation

## 2024-05-20 DIAGNOSIS — R600 Localized edema: Secondary | ICD-10-CM

## 2024-05-20 LAB — URINALYSIS, ROUTINE W REFLEX MICROSCOPIC
Bilirubin Urine: NEGATIVE
Glucose, UA: NEGATIVE mg/dL
Ketones, ur: 20 mg/dL — AB
Nitrite: NEGATIVE
Protein, ur: 30 mg/dL — AB
Specific Gravity, Urine: 1.018 (ref 1.005–1.030)
Squamous Epithelial / HPF: >50 /HPF (ref 0–5)
WBC, UA: >50 WBC/hpf (ref 0–5)
pH: 5 (ref 5.0–8.0)

## 2024-05-20 MED ORDER — LORATADINE 10 MG PO TABS: 10.0000 mg | ORAL_TABLET | Freq: Every day | ORAL | 0 refills | Status: DC

## 2024-05-20 MED ORDER — LORATADINE 10 MG PO TABS
10.0000 mg | ORAL_TABLET | Freq: Once | ORAL | Status: AC
  Administered 2024-05-20: 10 mg via ORAL
  Filled 2024-05-20: qty 1

## 2024-05-20 NOTE — ED Triage Notes (Signed)
 Pt here with c/o with eye swelling yesterday morning. Eyes are still swollen. No drainage. There is some redness to both eyes and swelling. Pt does have seasonal allergies. No pain or itching. Vision is unchanged

## 2024-05-21 NOTE — ED Provider Notes (Signed)
 [place] EMERGENCY DEPARTMENT AT [facility] Provider Note  CSN: [id number] Arrival date & time: 05/20/24 1506  Chief Complaint(s) Eye Problem (swelling)  HPI Cassidy Taylor is a 58 y.o. female with PMH HTN, HLD who presents emergency room for evaluation of periorbital edema.  States that she woke up this morning with swelling around bilateral orbits that is fairly mild but is atypical for her.  She denies associated vision loss, eye pain, edema of the upper or lower extremities, shortness of breath or any other systemic symptoms.   Past Medical History Past Medical History:  Diagnosis Date   Hyperlipemia    Hypertension    Obesity, morbid, BMI 50 or higher (HCC)    Vitamin D deficiency    There are no active problems to display for this patient.  Home Medication(s) Prior to Admission medications   Medication Sig Start Date End Date Taking? Authorizing Provider  loratadine (CLARITIN) 10 MG tablet Take 1 tablet (10 mg total) by mouth daily. 05/20/24 06/19/24 Yes Zakirah Weingart, MD  ergocalciferol (VITAMIN D2) 50000 units capsule Take 50,000 Units by mouth once a week.    [provider]  ibuprofen (ADVIL,MOTRIN) 200 MG tablet Take 200 mg by mouth as needed.    [provider]  lisinopril-hydrochlorothiazide (PRINZIDE,ZESTORETIC) 20-25 MG tablet Take 1 tablet by mouth daily.    [provider]  potassium chloride  SA (K-DUR) 20 MEQ tablet Take 1 tablet (20 mEq total) by mouth daily. 08/06/19   Camellia Caves, MD  simvastatin (ZOCOR) 40 MG tablet Take 40 mg by mouth daily.    [provider]                                                                                                                                    Past Surgical History Past Surgical History:  Procedure Laterality Date   BREAST BIOPSY Right 2010   benign   CESAREAN SECTION  02/1998   Family History Family History  Problem Relation Age of Onset   Breast  cancer Mother    Cancer Father        lung    Social History Social History   Tobacco Use   Smoking status: Never   Smokeless tobacco: Never  Vaping Use   Vaping status: Never Used  Substance Use Topics   Alcohol use: No   Drug use: No   Allergies Patient has no known allergies.  Review of Systems Review of Systems  HENT:  Positive for facial swelling.     Physical Exam Vital Signs  I have reviewed the triage vital signs BP (!) 149/74   Pulse 64   Temp 98.7 F (37.1 C) (Oral)   Resp 19   Ht 5\' 5"  (1.651 m)   Wt 100.7 kg   LMP 07/10/2019   SpO2 100%   BMI 36.94 kg/m   Physical Exam Vitals and nursing note  reviewed.  Constitutional:      General: She is not in acute distress.    Appearance: She is well-developed.  HENT:     Head: Normocephalic and atraumatic.     Comments: Mild periorbital edema Eyes:     Conjunctiva/sclera: Conjunctivae normal.  Cardiovascular:     Rate and Rhythm: Normal rate and regular rhythm.     Heart sounds: No murmur heard. Pulmonary:     Effort: Pulmonary effort is normal. No respiratory distress.     Breath sounds: Normal breath sounds.  Abdominal:     Palpations: Abdomen is soft.     Tenderness: There is no abdominal tenderness.  Musculoskeletal:        General: No swelling.     Cervical back: Neck supple.  Skin:    General: Skin is warm and dry.     Capillary Refill: Capillary refill takes less than 2 seconds.  Neurological:     Mental Status: She is alert.  Psychiatric:        Mood and Affect: Mood normal.     ED Results and Treatments Labs (all labs ordered are listed, but only abnormal results are displayed) Labs Reviewed  URINALYSIS, ROUTINE W REFLEX MICROSCOPIC - Abnormal; Notable for the following components:      Result Value   Color, Urine AMBER (*)    APPearance CLOUDY (*)    Hgb urine dipstick SMALL (*)    Ketones, ur 20 (*)    Protein, ur 30 (*)    Leukocytes,Ua LARGE (*)    Bacteria, UA FEW (*)     All other components within normal limits                                                                                                                          Radiology No results found.  Pertinent labs & imaging results that were available during my care of the patient were reviewed by me and considered in my medical decision making (see MDM for details).  Medications Ordered in ED Medications  loratadine (CLARITIN) tablet 10 mg (10 mg Oral Given 05/20/24 1921)                                                                                                                                     Procedures Procedures  (including critical care time)  Medical Decision Making / ED Course  This patient presents to the ED for concern of periorbital edema, this involves an extensive number of treatment options, and is a complaint that carries with it a high risk of complications and morbidity.  The differential diagnosis includes gravity dependent edema, allergic conjunctivitis, nephrotic syndrome, medication side effect  MDM: Patient seen emergency room for evaluation of periorbital edema.  Physical exam with very mild periorbital edema, no associated erythema or proptosis.  Urinalysis with very minimal proteinuria, large esterase, greater than 2 white blood cells but this sample is quite contaminated with greater than 50 squamous epithelial cells.  I have overall lower suspicion for true nephrotic syndrome today as she has swelling nowhere else.   Higher suspicion for allergic conjunctivitis and patient given Claritin.  On reevaluation her symptoms are improving.  Given improvement with antihistamine therapy, this is likely allergic conjunctivitis and we will defer additional laboratory evaluation at this time.  At this time she does not meet inpatient criteria for admission and will be discharged with outpatient follow-up.  Return precautions given which she voiced  understanding.   Additional history obtained:  -External records from outside source obtained and reviewed including: Chart review including previous notes, labs, imaging, consultation notes   Lab Tests: -I ordered, reviewed, and interpreted labs.   The pertinent results include:   Labs Reviewed  URINALYSIS, ROUTINE W REFLEX MICROSCOPIC - Abnormal; Notable for the following components:      Result Value   Color, Urine AMBER (*)    APPearance CLOUDY (*)    Hgb urine dipstick SMALL (*)    Ketones, ur 20 (*)    Protein, ur 30 (*)    Leukocytes,Ua LARGE (*)    Bacteria, UA FEW (*)    All other components within normal limits      Medicines ordered and prescription drug management: Meds ordered this encounter  Medications   loratadine (CLARITIN) tablet 10 mg   loratadine (CLARITIN) 10 MG tablet    Sig: Take 1 tablet (10 mg total) by mouth daily.    Dispense:  30 tablet    Refill:  0    -I have reviewed the patients home medicines and have made adjustments as needed  Critical interventions none     Social Determinants of Health:  Factors impacting patients care include: none   Reevaluation: After the interventions noted above, I reevaluated the patient and found that they have :improved  Co morbidities that complicate the patient evaluation  Past Medical History:  Diagnosis Date   Hyperlipemia    Hypertension    Obesity, morbid, BMI 50 or higher (HCC)    Vitamin D deficiency       Dispostion: I considered admission for this patient, but this time she does not meet inpatient criteria for admission will be discharged outpatient follow-up.     Final Clinical Impression(s) / ED Diagnoses Final diagnoses:  Periorbital edema     @PCDICTATION @    Karlyn Overman, MD 05/21/24 [phone]

## 2024-07-26 ENCOUNTER — Telehealth: Payer: Self-pay

## 2024-07-26 ENCOUNTER — Other Ambulatory Visit: Payer: Self-pay | Admitting: Internal Medicine

## 2024-07-26 DIAGNOSIS — Z1231 Encounter for screening mammogram for malignant neoplasm of breast: Secondary | ICD-10-CM

## 2024-09-11 DIAGNOSIS — N6459 Other signs and symptoms in breast: Secondary | ICD-10-CM

## 2024-09-14 ENCOUNTER — Other Ambulatory Visit: Payer: Self-pay

## 2024-09-14 DIAGNOSIS — N644 Mastodynia: Secondary | ICD-10-CM

## 2024-09-20 ENCOUNTER — Ambulatory Visit

## 2024-09-20 ENCOUNTER — Other Ambulatory Visit

## 2024-09-20 ENCOUNTER — Encounter

## 2024-09-25 ENCOUNTER — Ambulatory Visit: Admission: RE | Admit: 2024-09-25 | Discharge: 2024-09-25 | Disposition: A | Source: Ambulatory Visit

## 2024-09-25 ENCOUNTER — Encounter

## 2024-09-25 ENCOUNTER — Other Ambulatory Visit

## 2024-09-25 ENCOUNTER — Ambulatory Visit
Admission: RE | Admit: 2024-09-25 | Discharge: 2024-09-25 | Disposition: A | Discharge status: Home / Self Care | Source: Ambulatory Visit

## 2024-09-25 DIAGNOSIS — N644 Mastodynia: Secondary | ICD-10-CM

## 2024-11-03 ENCOUNTER — Ambulatory Visit (INDEPENDENT_AMBULATORY_CARE_PROVIDER_SITE_OTHER)

## 2024-11-03 ENCOUNTER — Encounter (HOSPITAL_COMMUNITY): Payer: Self-pay | Admitting: *Deleted

## 2024-11-03 ENCOUNTER — Ambulatory Visit (HOSPITAL_COMMUNITY): Admission: EM | Admit: 2024-11-03 | Discharge: 2024-11-03 | Disposition: A | Discharge status: Home / Self Care

## 2024-11-03 DIAGNOSIS — S63501A Unspecified sprain of right wrist, initial encounter: Secondary | ICD-10-CM | POA: Diagnosis not present

## 2024-11-03 DIAGNOSIS — M25531 Pain in right wrist: Secondary | ICD-10-CM | POA: Diagnosis not present

## 2024-11-03 MED ORDER — DICLOFENAC SODIUM 50 MG PO TBEC
50.0000 mg | DELAYED_RELEASE_TABLET | Freq: Two times a day (BID) | ORAL | 1 refills | Status: AC
Start: 2024-11-03 — End: ?

## 2024-11-03 NOTE — Discharge Instructions (Addendum)
  1. Sprain of right wrist, unspecified location, initial encounter (Primary) - DG Wrist Complete Right x-ray performed in UC shows no acute fracture or dislocation to the right wrist. - diclofenac (VOLTAREN) 50 MG EC tablet; Take 1 tablet (50 mg total) by mouth 2 (two) times daily.  Dispense: 30 tablet; Refill: 1 - Apply Wrist brace to right wrist for protection and immobilization until injury improves - AMB referral to orthopedics for follow-up evaluation if symptoms do not improve with current therapy.

## 2024-11-03 NOTE — ED Provider Notes (Signed)
 [facility] [place]  Note:  This document was prepared using Conservation officer, historic buildings and may include unintentional dictation errors.  MRN: [id number] DOB: 1966/02/04  Subjective:   Cassidy Taylor is a 58 y.o. female presenting for evaluation of acute onset right wrist pain since yesterday.  Patient denies any known injury or trauma.  No past history of wrist or hand pain.  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  Patient has been wearing a wrist weight Velcro strap to decrease movement of wrist.  Patient denies taking any over-the-counter medication to treat symptoms prior to arrival in urgent care.  No current facility-administered medications for this encounter.  Current Outpatient Medications:    diclofenac (VOLTAREN) 50 MG EC tablet, Take 1 tablet (50 mg total) by mouth 2 (two) times daily., Disp: 30 tablet, Rfl: 1   simvastatin (ZOCOR) 40 MG tablet, Take 40 mg by mouth daily., Disp: , Rfl:    ergocalciferol (VITAMIN D2) 50000 units capsule, Take 50,000 Units by mouth once a week., Disp: , Rfl:    ibuprofen (ADVIL,MOTRIN) 200 MG tablet, Take 200 mg by mouth as needed., Disp: , Rfl:    lisinopril-hydrochlorothiazide (PRINZIDE,ZESTORETIC) 20-25 MG tablet, Take 1 tablet by mouth daily., Disp: , Rfl:    loratadine  (CLARITIN ) 10 MG tablet, Take 1 tablet (10 mg total) by mouth daily., Disp: 30 tablet, Rfl: 0   potassium chloride  SA (K-DUR) 20 MEQ tablet, Take 1 tablet (20 mEq total) by mouth daily., Disp: 7 tablet, Rfl: 0   Allergies  Allergen Reactions   Sulfa Antibiotics Other (See Comments) and Hives    When pt takes Sulfa drugs her face gets very heated    Past Medical History:  Diagnosis Date   Hyperlipemia    Hypertension    Obesity, morbid, BMI 50 or higher (HCC)    Vitamin D deficiency      Past Surgical History:  Procedure Laterality Date    BREAST BIOPSY Right 2010   benign   CESAREAN SECTION  02/1998    Family History  Problem Relation Age of Onset   Breast cancer Mother    Cancer Father        lung    Social History   Tobacco Use   Smoking status: Never   Smokeless tobacco: Never  Vaping Use   Vaping status: Never Used  Substance Use Topics   Alcohol use: No   Drug use: No    ROS Refer to HPI for ROS details.  Objective:    Vitals: BP 124/84 (BP Location: Left Arm)   Pulse 63   Temp 98.1 F (36.7 C) (Oral)   Resp 16   LMP 07/10/2019   SpO2 99%   Physical Exam Vitals and nursing note reviewed.  Constitutional:      General: She is not in acute distress.    Appearance: Normal appearance. She is well-developed. She is not ill-appearing or toxic-appearing.  HENT:     Head: Normocephalic and atraumatic.  Cardiovascular:     Rate and Rhythm: Normal rate.  Pulmonary:     Effort: Pulmonary effort is normal. No respiratory distress.  Musculoskeletal:     Right wrist: Swelling, tenderness, bony tenderness and snuff box tenderness present. No deformity or crepitus. Decreased range of motion. Normal pulse.  Skin:    General: Skin is warm and dry.  Neurological:  General: No focal deficit present.     Mental Status: She is alert and oriented to person, place, and time.  Psychiatric:        Mood and Affect: Mood normal.        Behavior: Behavior normal.     Procedures  No results found for this or any previous visit (from the past 24 hours).  Assessment and Plan :     Discharge Instructions       1. Sprain of right wrist, unspecified location, initial encounter (Primary) - DG Wrist Complete Right x-ray performed in UC shows no acute fracture or dislocation to the right wrist. - diclofenac (VOLTAREN) 50 MG EC tablet; Take 1 tablet (50 mg total) by mouth 2 (two) times daily.  Dispense: 30 tablet; Refill: 1 - Apply Wrist brace to right wrist for protection and immobilization until injury  improves - AMB referral to orthopedics for follow-up evaluation if symptoms do not improve with current therapy.      Rylie Limburg B Jakaria Lavergne   Brayn Eckstein, [place] B, [place] 11/03/24 1659

## 2024-11-03 NOTE — ED Triage Notes (Signed)
 Pt states she has right wrist pain since yesterday. 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 She is wearing a weight on that right wrist. She has not taken any meds.

## 2024-12-31 ENCOUNTER — Other Ambulatory Visit: Payer: Self-pay

## 2025-01-01 ENCOUNTER — Other Ambulatory Visit (HOSPITAL_COMMUNITY)
Admission: RE | Admit: 2025-01-01 | Discharge: 2025-01-01 | Disposition: A | Discharge status: Home / Self Care | Source: Ambulatory Visit | Attending: Obstetrics and Gynecology | Admitting: Obstetrics and Gynecology

## 2025-01-01 ENCOUNTER — Encounter: Payer: Self-pay | Admitting: Obstetrics and Gynecology

## 2025-01-01 ENCOUNTER — Ambulatory Visit: Admitting: Obstetrics and Gynecology

## 2025-01-01 VITALS — BP 113/79 | HR 67 | Ht 65.0 in | Wt 174.0 lb

## 2025-01-01 DIAGNOSIS — N898 Other specified noninflammatory disorders of vagina: Secondary | ICD-10-CM | POA: Diagnosis present

## 2025-01-01 DIAGNOSIS — R102 Pelvic and perineal pain unspecified side: Secondary | ICD-10-CM | POA: Diagnosis not present

## 2025-01-01 DIAGNOSIS — F39 Unspecified mood [affective] disorder: Secondary | ICD-10-CM | POA: Diagnosis not present

## 2025-01-01 NOTE — Progress Notes (Addendum)
 59 y.o. New GYN presents for vaginal looseness/pressure x4 months.  PHQ-9=7   //  GAD-7=2  Referral done.  Last Mammogram 09/25/24 Neg Last PAP  06/18/2022  NILM

## 2025-01-01 NOTE — Progress Notes (Signed)
 "  NEW GYNECOLOGY VISIT Chief Complaint  Patient presents with   New Patient (Initial Visit)    GYN     Subjective:  Cassidy Taylor is a 59 y.o. G3P0 who presents for pelvic pressure.  She is unsure if she has prolapse. Reports pressure/bulge feeling mostly when she urinates. Finds that her urine will sometimes veer to the side when she hovers over the toilet seat in a public bathroom. Denies urinary leakage. Denies pelvic pain. She notes some vaginal discharge Hx SVD x 2, C/S x 1 with larges baby > 9 lbs Some constipation No hx abnormal pap smears   OB History     Gravida  3   Para      Term      Preterm      AB      Living  3      SAB      IAB      Ectopic      Multiple      Live Births  3           Past Medical History:  Diagnosis Date   Anemia    Hyperlipemia    Hypertension    Obesity, morbid, BMI 50 or higher (HCC)    Vitamin D deficiency     Past Surgical History:  Procedure Laterality Date   BREAST BIOPSY Right 2010   benign   CESAREAN SECTION  02/1998    Social History   Socioeconomic History   Marital status: Single    Spouse name: Not on file   Number of children: 3   Years of education: Not on file   Highest education level: Not on file  Occupational History   Not on file  Tobacco Use   Smoking status: Never   Smokeless tobacco: Never  Vaping Use   Vaping status: Never Used  Substance and Sexual Activity   Alcohol use: No   Drug use: No   Sexual activity: Not Currently  Other Topics Concern   Not on file  Social History Narrative   Not on file   Social Drivers of Health   Tobacco Use: Low Risk (01/01/2025)   Patient History    Smoking Tobacco Use: Never    Smokeless Tobacco Use: Never    Passive Exposure: Not on file  Financial Resource Strain: Not on File (04/15/2022)   Received from [employer]    Financial Resource Strain: 0  Food Insecurity: Not at Risk (07/19/2024)   Received from  [facility] Insecurity    Within the past 12 months, you worried that your food would run out before you got money to buy more.: 1  Transportation Needs: Not at Risk (07/19/2024)   Received from [facility] Needs    In the past 12 months, has lack of transportation kept you from medical appointments, meetings, work or from getting things needed for daily living?: 1  Physical Activity: Not on File (04/15/2022)   Received from [facility]   Physical Activity    Physical Activity: 0  Stress: Not on File (04/15/2022)   Received from [facility]   Stress    Stress: 0  Social Connections: Not on File (09/05/2023)   Received from [facility]   Social Connections    Connectedness: 0  Depression (PHQ2-9): Medium Risk (01/01/2025)   Depression (PHQ2-9)    PHQ-2 Score: 7  Alcohol Screen: Not on file  Housing: Not on file  Utilities: Not on file  Health Literacy: Not on file    Family History  Problem Relation Age of Onset   Breast cancer Mother    Cancer Father        lung    Medications Ordered Prior to Encounter[1]  Allergies[2]   Objective:   Vitals:   01/01/25 1547  BP: 113/79  Pulse: 67  Weight: 174 lb (78.9 kg)  Height: 5' 5 (1.651 m)   Physical Examination:   General appearance - well appearing, and in no distress  Mental status - alert, oriented to person, place, and time  Psych:  normal mood and affect  Abdomen - soft, nontender, nondistended, no masses or organomegaly  Pelvic -  VULVA: normal appearing vulva with no masses, tenderness or lesions   VAGINA: normal appearing vagina with normal color and discharge, no lesions CERVIX: normal appearing cervix without discharge or lesions, no CMT  UTERUS: uterus is felt to be normal size, shape, consistency and nontender   ADNEXA: No adnexal masses or tenderness noted.  No prolapse noted with valsalva  Extremities:  No swelling or varicosities noted  Chaperone present for exam     01/01/2025    3:52 PM  Depression screen  PHQ 2/9  Decreased Interest 1  Down, Depressed, Hopeless 1  PHQ - 2 Score 2  Altered sleeping 0  Tired, decreased energy 1  Change in appetite 0  Feeling bad or failure about yourself  2  Trouble concentrating 0  Moving slowly or fidgety/restless 2  Suicidal thoughts 0  PHQ-9 Score 7  Difficult doing work/chores Not difficult at all      01/01/2025    3:53 PM  GAD 7 : Generalized Anxiety Score  Nervous, Anxious, on Edge 1  Control/stop worrying 0  Worry too much - different things 0  Trouble relaxing 1  Restless 0  Easily annoyed or irritable 0  Afraid - awful might happen 0  Total GAD 7 Score 2  Anxiety Difficulty Not difficult at all      Assessment and Plan:  1. Pelvic pressure in female (Primary) Patient with risk factors for prolapse but no evidence of such on exam today with valsalva. Will get pelvic ultrasound to evaluate for causes of pelvic pressure. If normal, consider urology referral since symptoms are mostly present with voiding and she may have some voiding dysfunction - US  PELVIC COMPLETE WITH TRANSVAGINAL; Future  2. Vaginal discharge - Cervicovaginal ancillary only( [place])  3. Mood disorder - Ambulatory referral to Integrated Behavioral Health    No follow-ups on file.  No future appointments.  [name], MD, FACOG Obstetrician & Gynecologist, [facility], [facility]     [1]  Current Outpatient Medications on File Prior to Visit  Medication Sig Dispense Refill   diclofenac  (VOLTAREN ) 50 MG EC tablet Take 1 tablet (50 mg total) by mouth 2 (two) times daily. 30 tablet 1   ergocalciferol (VITAMIN D2) 50000 units capsule Take 50,000 Units by mouth once a week.     ibuprofen (ADVIL,MOTRIN) 200 MG tablet Take 200 mg by mouth as needed.     lisinopril-hydrochlorothiazide (PRINZIDE,ZESTORETIC) 20-25 MG tablet Take 1 tablet by mouth daily.     loratadine  (CLARITIN ) 10 MG tablet Take 1  tablet (10 mg total) by mouth daily. 30 tablet 0   potassium chloride  SA (K-DUR) 20 MEQ tablet Take 1 tablet (20 mEq total) by mouth daily. 7 tablet 0  simvastatin (ZOCOR) 40 MG tablet Take 40 mg by mouth daily.     No current facility-administered medications on file prior to visit.  [2]  Allergies Allergen Reactions   Sulfa Antibiotics Other (See Comments) and Hives    When pt takes Sulfa drugs her face gets very heated   "

## 2025-01-02 LAB — CERVICOVAGINAL ANCILLARY ONLY
Bacterial Vaginitis (gardnerella): POSITIVE — AB
Candida Glabrata: NEGATIVE
Candida Vaginitis: NEGATIVE
Chlamydia: NEGATIVE
Comment: NEGATIVE
Comment: NEGATIVE
Comment: NEGATIVE
Comment: NEGATIVE
Comment: NEGATIVE
Comment: NORMAL
Neisseria Gonorrhea: NEGATIVE
Trichomonas: NEGATIVE

## 2025-01-03 ENCOUNTER — Ambulatory Visit: Payer: Self-pay | Admitting: Obstetrics and Gynecology

## 2025-01-03 DIAGNOSIS — B9689 Other specified bacterial agents as the cause of diseases classified elsewhere: Secondary | ICD-10-CM

## 2025-01-03 MED ORDER — METRONIDAZOLE 0.75 % VA GEL: 1.0000 | Freq: Every day | VAGINAL | 1 refills | Status: AC

## 2025-01-08 ENCOUNTER — Ambulatory Visit (HOSPITAL_COMMUNITY)
Admission: RE | Admit: 2025-01-08 | Discharge: 2025-01-08 | Disposition: A | Discharge status: Home / Self Care | Source: Ambulatory Visit | Attending: Obstetrics and Gynecology | Admitting: Obstetrics and Gynecology

## 2025-01-08 DIAGNOSIS — R102 Pelvic and perineal pain unspecified side: Secondary | ICD-10-CM | POA: Insufficient documentation

## 2025-01-22 ENCOUNTER — Ambulatory Visit: Payer: Self-pay | Admitting: Licensed Clinical Social Worker

## 2025-01-22 DIAGNOSIS — F4323 Adjustment disorder with mixed anxiety and depressed mood: Secondary | ICD-10-CM

## 2025-01-22 NOTE — BH Specialist Note (Signed)
 " Integrated Behavioral Health via Telemedicine Visit  01/31/2025 Cassidy Taylor [id number]  Number of Integrated Behavioral Health Clinician visits: 1- Initial Visit  Session Start time: 1415   Session End time: 1506   Total time in minutes: 51  Referring Provider: Abigail, MD Patient/Family location: Home [facility] Provider location: Remote Office All persons participating in visit: Patient and [facility] Types of Service: Individual psychotherapy and Video visit  I connected with Cassidy Taylor and/or Cassidy Taylor's patient via  Telephone or Video Enabled Telemedicine Application  (Video is Caregility application) and verified that I am speaking with the correct person using two identifiers. Discussed confidentiality: Yes   I discussed the limitations of telemedicine and the availability of in person appointments.  Discussed there is a possibility of technology failure and discussed alternative modes of communication if that failure occurs.  I discussed that engaging in this telemedicine visit, they consent to the provision of behavioral healthcare and the services will be billed under their insurance.  Patient and/or legal guardian expressed understanding and consented to Telemedicine visit: Yes   Presenting Concerns: Patient and/or family reports the following symptoms/concerns: Increased anxiety and depressive symptoms.  Duration of problem: Months; Severity of problem: moderate  Patient and/or Family's Strengths/Protective Factors: Social connections, Social and Emotional competence, Concrete supports in place (healthy food, safe environments, etc.), and Physical Health (exercise, healthy diet, medication compliance, etc.)  Goals Addressed: Patient will:  Reduce symptoms of: anxiety and depression   Increase knowledge and/or ability of: coping skills and healthy habits   Demonstrate ability to: Increase healthy adjustment to current life circumstances and Increase adequate support  systems for patient/family  Progress towards Goals: Ongoing    Interventions: Interventions utilized:  Solution-Focused Strategies, Supportive Counseling, Psychoeducation and/or Health Education, Communication Skills, and Supportive Reflection Standardized Assessments completed: Not Needed    Patient and/or Family Response: Patient was present for todays virtual session. She reports being a mother of three, with her youngest child being 59 years old. Patient reports a history of depressive symptoms, anxiety, and intermittent anger-related concerns. 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 Patient reports financial limitations and caregiving responsibilities have restricted her ability to engage in activities outside the home. She reports previously enjoying activities such as going to the movies, shopping, dining out, visiting parks, and walking in the community. Patient plans to explore more in-home activities, including resuming sewing and utilizing YouTube videos for exercise. She also plans to communicate with family members to request assistance with caregiving responsibilities one to two times per month and plans to reengage in weekly Bible study on Saturdays, which she reports was previously beneficial.   Clinical Assessment/Diagnosis  Adjustment disorder with mixed anxiety and depressed mood    Assessment: Patient currently experiencing stress and fatigue related to being the sole caregiver for her sibling, along with feelings of isolation and limited ability to engage in previously enjoyed activities. She reports mild depressive and anxious symptoms and is seeking ways to reintroduce meaningful routines into her life..   Patient may benefit from continued support of integrated behavioral health services.  Plan: Follow up  with behavioral health clinician on : 01/31/2025 Behavioral recommendations: encouraged to gradually reintroduce enjoyable and purposeful activities at home, including sewing, exercise videos, and Bible study, to support mood and well-being. She is also advised to coordinate with family  members for periodic caregiving assistance to reduce stress and prevent caregiver burnout. Referral(s): Integrated [employer] (In Clinic)  I discussed the assessment and treatment plan with the patient and/or parent/guardian. They were provided an opportunity to ask questions and all were answered. They agreed with the plan and demonstrated an understanding of the instructions.   They were advised to call back or seek an in-person evaluation if the symptoms worsen or if the condition fails to improve as anticipated.  Braniya Farrugia LITTIE Seats, LCSWA "

## 2025-01-31 ENCOUNTER — Encounter: Payer: Self-pay | Admitting: Licensed Clinical Social Worker
